# Patient Record
Sex: Female | Born: 1950 | Race: Black or African American | Hispanic: No | Marital: Married | Smoking: Never smoker
Health system: Southern US, Community
[De-identification: ages and names within clinical notes are randomized; demographics above are authoritative.]

## PROBLEM LIST (undated history)

## (undated) DIAGNOSIS — E119 Type 2 diabetes mellitus without complications: Secondary | ICD-10-CM

## (undated) DIAGNOSIS — I10 Essential (primary) hypertension: Secondary | ICD-10-CM

## (undated) HISTORY — DX: Type 2 diabetes mellitus without complications: E11.9

## (undated) HISTORY — DX: Essential (primary) hypertension: I10

---

## 2004-06-13 ENCOUNTER — Inpatient Hospital Stay (HOSPITAL_COMMUNITY): Admission: EM | Admit: 2004-06-13 | Discharge: 2004-06-22 | Payer: Self-pay | Admitting: Emergency Medicine

## 2004-06-13 ENCOUNTER — Ambulatory Visit: Payer: Self-pay | Admitting: Physical Medicine & Rehabilitation

## 2004-09-06 ENCOUNTER — Encounter: Admission: RE | Admit: 2004-09-06 | Discharge: 2004-12-05 | Payer: Self-pay | Admitting: Orthopedic Surgery

## 2004-12-06 ENCOUNTER — Encounter: Admission: RE | Admit: 2004-12-06 | Discharge: 2005-03-06 | Payer: Self-pay | Admitting: Orthopedic Surgery
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
## 2009-03-29 ENCOUNTER — Emergency Department (HOSPITAL_BASED_OUTPATIENT_CLINIC_OR_DEPARTMENT_OTHER): Admission: EM | Admit: 2009-03-29 | Discharge: 2009-03-29 | Payer: Self-pay | Admitting: Emergency Medicine

## 2009-03-29 ENCOUNTER — Ambulatory Visit: Payer: Self-pay | Admitting: Diagnostic Radiology

## 2009-07-15 ENCOUNTER — Encounter: Admission: RE | Admit: 2009-07-15 | Discharge: 2009-07-15 | Payer: Self-pay | Admitting: Orthopedic Surgery

## 2010-08-04 ENCOUNTER — Encounter: Admission: RE | Admit: 2010-08-04 | Discharge: 2010-08-04 | Payer: Self-pay | Admitting: Orthopedic Surgery

## 2010-09-20 ENCOUNTER — Encounter: Payer: Self-pay | Admitting: Gastroenterology

## 2010-09-26 ENCOUNTER — Encounter (INDEPENDENT_AMBULATORY_CARE_PROVIDER_SITE_OTHER): Payer: Self-pay | Admitting: *Deleted

## 2010-09-29 ENCOUNTER — Encounter (INDEPENDENT_AMBULATORY_CARE_PROVIDER_SITE_OTHER): Payer: Self-pay | Admitting: *Deleted

## 2010-09-29 ENCOUNTER — Ambulatory Visit
Admission: RE | Admit: 2010-09-29 | Discharge: 2010-09-29 | Payer: Self-pay | Source: Home / Self Care | Attending: Gastroenterology | Admitting: Gastroenterology

## 2010-10-12 NOTE — Letter (Signed)
Summary: [facility] Instructions  [facility]  [address] [place], [place] [address]   Phone: [phone]  Fax: [phone]       Felicia Grimes    March 04, 1951    MRN: [id number]        Procedure Day Dorna Bloom:  Farrell Ours 11/03/10     Arrival Time:  9:00am     Procedure Time: 10:00am     Location of Procedure:                    Juliann Pares _  [facility] (4th Floor)                       PREPARATION FOR COLONOSCOPY WITH MOVIPREP   Starting 5 days prior to your procedure SUNDAY  02/19  do not eat nuts, seeds, popcorn, corn, beans, peas,  salads, or any raw vegetables.  Do not take any fiber supplements (e.g. Metamucil, Citrucel, and Benefiber).  THE DAY BEFORE YOUR PROCEDURE         DATE:  THURSDAY 02/23  1.  Drink clear liquids the entire day-NO SOLID FOOD  2.  Do not drink anything colored red or purple.  Avoid juices with pulp.  No orange juice.  3.  Drink at least 64 oz. (8 glasses) of fluid/clear liquids during the day to prevent dehydration and help the prep work efficiently.  CLEAR LIQUIDS INCLUDE: Water Jello Ice Popsicles Tea (sugar ok, no milk/cream) Powdered fruit flavored drinks Coffee (sugar ok, no milk/cream) Gatorade Juice: apple, white grape, white cranberry  Lemonade Clear bullion, consomm, broth Carbonated beverages (any kind) Strained chicken noodle soup Hard Candy                             4.  In the morning, mix first dose of MoviPrep solution:    Empty 1 Pouch A and 1 Pouch B into the disposable container    Add lukewarm drinking water to the top line of the container. Mix to dissolve    Refrigerate (mixed solution should be used within 24 hrs)  5.  Begin drinking the prep at 5:00 p.m. The MoviPrep container is divided by 4 marks.   Every 15 minutes drink the solution down to the next mark (approximately 8 oz) until the full liter is complete.   6.  Follow completed prep with 16 oz of clear liquid of your choice (Nothing  red or purple).  Continue to drink clear liquids until bedtime.  7.  Before going to bed, mix second dose of MoviPrep solution:    Empty 1 Pouch A and 1 Pouch B into the disposable container    Add lukewarm drinking water to the top line of the container. Mix to dissolve    Refrigerate  THE DAY OF YOUR PROCEDURE      DATE: FRIDAY  02/24  Beginning at  5:00 a.m. (5 hours before procedure):         1. Every 15 minutes, drink the solution down to the next mark (approx 8 oz) until the full liter is complete.  2. Follow completed prep with 16 oz. of clear liquid of your choice.    3. You may drink clear liquids until  8:00am  (2 HOURS BEFORE PROCEDURE).   MEDICATION INSTRUCTIONS  Unless otherwise instructed, you should take regular prescription medications with a small sip of water   as early as possible  the morning of your procedure.  Diabetic patients - see separate instructions.           OTHER INSTRUCTIONS  You will need a responsible adult at least 61 years of age to accompany you and drive you home.   This person must remain in the waiting room during your procedure.  Wear loose fitting clothing that is easily removed.  Leave jewelry and other valuables at home.  However, you may wish to bring a book to read or  an iPod/MP3 player to listen to music as you wait for your procedure to start.  Remove all body piercing jewelry and leave at home.  Total time from sign-in until discharge is approximately 2-3 hours.  You should go home directly after your procedure and rest.  You can resume normal activities the  day after your procedure.  The day of your procedure you should not:   Drive   Make legal decisions   Operate machinery   Drink alcohol   Return to work  You will receive specific instructions about eating, activities and medications before you leave.    The above instructions have been reviewed and explained to me by   Sherren Kerns RN  September 29, 2010 11:12 AM    I fully understand and can verbalize these instructions _____________________________ Date _________

## 2010-10-12 NOTE — Miscellaneous (Signed)
Summary: previsit prep/rm  Clinical Lists Changes  Medications: Added new medication of MOVIPREP 100 GM  SOLR (PEG-KCL-NACL-NASULF-NA ASC-C) As per prep instructions. - Signed Rx of MOVIPREP 100 GM  SOLR (PEG-KCL-NACL-NASULF-NA ASC-C) As per prep instructions.;  #1 x 0;  Signed;  Entered by: Sherren Kerns RN;  Authorized by: Meryl Dare MD [facility];  Method used: Electronically to [id number] [address], [place], [place]  [address], Ph: [phone], Fax: [phone] Allergies: Added new allergy or adverse reaction of METACILLIAN Observations: Added new observation of NKA: F (09/29/2010 10:50)    Prescriptions: MOVIPREP 100 GM  SOLR (PEG-KCL-NACL-NASULF-NA ASC-C) As per prep instructions.  #1 x 0   Entered by:   Sherren Kerns RN   Authorized by:   Meryl Dare MD [facility]   Signed by:   Sherren Kerns RN on 09/29/2010   Method used:   Electronically to        [facility] (retail)       [address]       [place], [place]  [address]       Ph: [phone]       Fax: [phone]   RxID:   [id number]

## 2010-10-12 NOTE — Letter (Signed)
Summary: Pre Visit Letter Revised  [facility]  [address] [place], [place] [address]   Phone: [phone]  Fax: [phone]        09/20/2010 MRN: [id number]  Felicia Grimes [address] [place], [place]  [address]             Procedure Date:  2-15 at 9:30am           Direct Colon- Dr Russella Dar   Welcome to the Gastroenterology Division at [facility].    You are scheduled to see a nurse for your pre-procedure visit on 10-11-2010 at 10am on the 3rd floor at [facility], [address]  We ask that you try to arrive at our office 15 minutes prior to your appointment time to allow for check-in.  Please take a minute to review the attached form.  If you answer "Yes" to one or more of the questions on the first page, we ask that you call the person listed at your earliest opportunity.  If you answer "No" to all of the questions, please complete the rest of the form and bring it to your appointment.    Your nurse visit will consist of discussing your medical and surgical history, your immediate family medical history, and your medications.   If you are unable to list all of your medications on the form, please bring the medication bottles to your appointment and we will list them.  We will need to be aware of both prescribed and over the counter drugs.  We will need to know exact dosage information as well.    Please be prepared to read and sign documents such as consent forms, a financial agreement, and acknowledgement forms.  If necessary, and with your consent, a friend or relative is welcome to sit-in on the nurse visit with you.  Please bring your insurance card so that we may make a copy of it.  If your insurance requires a referral to see a specialist, please bring your referral form from your primary care physician.  No co-pay is required for this nurse visit.     If you cannot keep your appointment, please call [phone] to cancel or reschedule prior  to your appointment date.  This allows [place] the opportunity to schedule an appointment for another patient in need of care.    Thank you for choosing [facility] for your medical needs.  We appreciate the opportunity to care for you.  Please visit [place] at our website  to learn more about our practice.  Sincerely, The Gastroenterology Division

## 2010-10-12 NOTE — Letter (Signed)
Summary: Diabetic Instructions  [facility]  [address] [place], [place] [address]   Phone: [phone]  Fax: [phone]    Felicia Grimes [birth year] MRN: [id number]   _x  _   ORAL DIABETIC MEDICATION INSTRUCTIONS  The day before your procedure:   Take your diabetic pill as you do normally  The day of your procedure:   Do not take your diabetic pill    We will check your blood sugar levels during the admission process and again in Recovery before discharging you home  ________________________________________________________________________  _

## 2010-11-03 ENCOUNTER — Other Ambulatory Visit: Payer: Self-pay | Admitting: Gastroenterology
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
## 2011-01-26 NOTE — Op Note (Signed)
NAMESEHAJ, KOLDEN NO.:  [id number]   MEDICAL RECORD NO.:  [id number]          PATIENT TYPE:  INP   LOCATION:  5028                         FACILITY:  [facility]   PHYSICIAN:  Myrtie Neither, MD      DATE OF BIRTH:  [birth year]   DATE OF PROCEDURE:  06/14/2004  DATE OF DISCHARGE:  06/22/2004                                 OPERATIVE REPORT   PREOPERATIVE DIAGNOSIS:  Fracture dislocation of right hip with comminuted  fracture of the acetabulum.   ANESTHESIA:  General.   PROCEDURES:  1.  Open reduction and internal fixation of right acetabulum.  2.  Open reduction of right dislocated hip.   DESCRIPTION OF PROCEDURE:  The patient was taken to the operating room after  being given adequate preoperative medication, given anesthesia and  intubated.  The patient was placed in the right lateral position.  The right  hip was prepped with Duraprep and draped in a sterile manner.  Bovie used  for hemostasis.  A __________ approach was made over the right hip going  through the skin and subcutaneous tissue down to the fascia lata.  Sharp  rotators were identified.  Hematoma evacuated.  Sharp rotators were  released.  The femoral head was proximal to the acetabulum.  With gentle  traction, the femoral head was then reduced into the acetabulum.  Two  comminuted fragments of the posterior acetabulum were then identified along  the posterior lip.  Attempt at maintaining the hip reduced in a neutral  position to internal rotation demonstrated the hip was unstable.  The  femoral head had some abrasion to the articular surface.  The two fragments  were anatomically reduced and stabilize with two cancellus screws.  The  sciatic nerve was identified and found to be intact.  Copious irrigation  with antibiotic solution was done.  The capsule was reapproximated with 0  Vicryl.  Sharp rotators were pulled back up and sutured down.  The fascia  was approximated with 0 Vicryl, 2-0 for  subcutaneous and skin staples for  the skin.  A bulky compressive dressing was applied.  The patient was placed  in a hip abduction pillow to be fitted with a hip abduction brace later.  The patient tolerated the procedure quite well and went to the recovery room  in satisfactory and stable condition.      AC/MEDQ  D:  11/29/2004  T:  11/29/2004  Job:  [id number]

## 2011-01-26 NOTE — Op Note (Signed)
Felicia Grimes, KAHLER NO.:  [id number]   MEDICAL RECORD NO.:  [id number]          PATIENT TYPE:  INP   LOCATION:  5028                         FACILITY:  [facility]   PHYSICIAN:  Myrtie Neither, M.D.    DATE OF BIRTH:  03-22-1951   DATE OF PROCEDURE:  06/13/2004  DATE OF DISCHARGE:                                 OPERATIVE REPORT   PREOPERATIVE DIAGNOSIS:  Fracture dislocation right hip with fracture  posterior lip of acetabulum and dislocation of the hip joint.   POSTOPERATIVE DIAGNOSIS:  Fracture dislocation right hip with fracture  posterior lip of acetabulum and dislocation of the hip joint.   ANESTHESIA:  General.   PROCEDURE:  Open reduction and internal fixation of acetabular fracture.   DESCRIPTION OF PROCEDURE:  The patient was taken to the operating room and  given preoperative medications, general anesthesia, and intubated.  The  patient was placed in the right lateral position. C-arm was used to  visualize fracture reduction and extension.  The right hip was prepped with  DuraPrep and draped in a sterile manner.  The Bovie was used for hemostasis.  A posterior approach was made over the right hip going through the skin,  subcutaneous tissue, down to the fascia lata.  Sharp and blunt dissection  was made down to the fracture site.  The sciatic nerve was retracted  posteriorly.  After identification of the fracture site, a manipulated  reduction of the hip joint, itself, was done and the hip was reduced.  This  was visualized with the use of C-arm.  A large posterior fragment of the  acetabulum was knocked off.  Th fragments were removed from the joint and  irrigation was done.  Manipulated reduction of the fracture fragment was  done and stabilized with a K-wire followed by placement of two cancellous  compression screws.  The K-wire was removed.  The fracture site was fixed in  a good, stable, anatomic position.  Again, this was visualized with the use  of C-arm.  Copious and abundant irrigation was done.  Range of motion of the  hip was tested and found to be stable with stable fixation of the fracture  site.  The wound was closed with 0 Vicryl for the fascia, 2-0 for the  subcutaneous, and skin staples for the skin.  A compressive dressing was  applied.  The patient was placed in the hip abduction pillow and will be  placed in a hip abduction brace.  The patient tolerated the procedure well  and went to the recovery room in stable, satisfactory condition.       AC/MEDQ  D:  06/14/2004  T:  06/14/2004  Job:  [id number]
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
## 2012-07-21 ENCOUNTER — Ambulatory Visit
Admission: RE | Admit: 2012-07-21 | Discharge: 2012-07-21 | Disposition: A | Discharge status: Home / Self Care | Payer: BC Managed Care – PPO | Source: Ambulatory Visit | Attending: Orthopedic Surgery | Admitting: Orthopedic Surgery

## 2012-07-21 ENCOUNTER — Other Ambulatory Visit: Payer: Self-pay | Admitting: Orthopedic Surgery

## 2012-07-21 DIAGNOSIS — M1990 Unspecified osteoarthritis, unspecified site: Secondary | ICD-10-CM

## 2014-07-16 ENCOUNTER — Ambulatory Visit
Admission: RE | Admit: 2014-07-16 | Discharge: 2014-07-16 | Disposition: A | Discharge status: Home / Self Care | Payer: BC Managed Care – PPO | Source: Ambulatory Visit | Attending: Orthopedic Surgery | Admitting: Orthopedic Surgery

## 2014-07-16 ENCOUNTER — Other Ambulatory Visit: Payer: Self-pay | Admitting: Orthopedic Surgery

## 2014-07-16 DIAGNOSIS — M1610 Unilateral primary osteoarthritis, unspecified hip: Secondary | ICD-10-CM

## 2014-07-16 DIAGNOSIS — M5430 Sciatica, unspecified side: Secondary | ICD-10-CM

## 2022-07-20 ENCOUNTER — Emergency Department (HOSPITAL_BASED_OUTPATIENT_CLINIC_OR_DEPARTMENT_OTHER): Payer: Medicare PPO

## 2022-07-20 ENCOUNTER — Encounter (HOSPITAL_BASED_OUTPATIENT_CLINIC_OR_DEPARTMENT_OTHER): Payer: Self-pay | Admitting: Emergency Medicine

## 2022-07-20 ENCOUNTER — Emergency Department (HOSPITAL_BASED_OUTPATIENT_CLINIC_OR_DEPARTMENT_OTHER)
Admission: EM | Admit: 2022-07-20 | Discharge: 2022-07-21 | Disposition: A | Discharge status: Home / Self Care | Payer: Medicare PPO | Attending: Emergency Medicine | Admitting: Emergency Medicine

## 2022-07-20 ENCOUNTER — Other Ambulatory Visit: Payer: Self-pay

## 2022-07-20 DIAGNOSIS — R059 Cough, unspecified: Secondary | ICD-10-CM | POA: Insufficient documentation

## 2022-07-20 DIAGNOSIS — Z1152 Encounter for screening for COVID-19: Secondary | ICD-10-CM | POA: Insufficient documentation

## 2022-07-20 DIAGNOSIS — I10 Essential (primary) hypertension: Secondary | ICD-10-CM | POA: Insufficient documentation

## 2022-07-20 DIAGNOSIS — Z79899 Other long term (current) drug therapy: Secondary | ICD-10-CM | POA: Diagnosis not present

## 2022-07-20 DIAGNOSIS — R058 Other specified cough: Secondary | ICD-10-CM | POA: Insufficient documentation

## 2022-07-20 DIAGNOSIS — E119 Type 2 diabetes mellitus without complications: Secondary | ICD-10-CM | POA: Diagnosis not present

## 2022-07-20 DIAGNOSIS — R051 Acute cough: Secondary | ICD-10-CM

## 2022-07-20 LAB — RESP PANEL BY RT-PCR (FLU A&B, COVID) ARPGX2
Influenza A by PCR: NEGATIVE
Influenza B by PCR: NEGATIVE
SARS Coronavirus 2 by RT PCR: NEGATIVE

## 2022-07-20 NOTE — ED Triage Notes (Signed)
Cough x 5 days. Unrelieved with otc meds.

## 2022-07-21 MED ORDER — METOPROLOL SUCCINATE ER 50 MG PO TB24: 50.0000 mg | ORAL_TABLET | Freq: Every day | ORAL | 0 refills | Status: AC

## 2022-07-21 MED ORDER — NIFEDIPINE ER OSMOTIC RELEASE 30 MG PO TB24: 30.0000 mg | ORAL_TABLET | Freq: Every day | ORAL | 0 refills | Status: AC

## 2022-07-21 MED ORDER — HYDROCHLOROTHIAZIDE 12.5 MG PO TABS: 12.5000 mg | ORAL_TABLET | Freq: Every day | ORAL | 0 refills | Status: AC

## 2022-07-21 NOTE — ED Provider Notes (Signed)
[facility] EMERGENCY DEPARTMENT  Provider Note  CSN: [id number] Arrival date & time: 07/20/22 2111  History Chief Complaint  Patient presents with   Cough    Felicia Grimes is a 71 y.o. female with history of HTN, DM reports 5 days of cough, occasionally productive of sputum. No fever, no CP or SOB. She has tried OTC medications with some improvement. Called in Viola by PCP which did not help.   She also reports she lost her HTN medications, needs a refill.    Home Medications Prior to Admission medications   Medication Sig Start Date End Date Taking? Authorizing Provider  hydrochlorothiazide (HYDRODIURIL) 12.5 MG tablet Take 1 tablet (12.5 mg total) by mouth daily. 07/21/22 08/20/22 Yes Pollyann Savoy, MD  metoprolol succinate (TOPROL-XL) 50 MG 24 hr tablet Take 1 tablet (50 mg total) by mouth daily. 07/21/22 08/20/22 Yes Pollyann Savoy, MD  NIFEdipine (PROCARDIA XL) 30 MG 24 hr tablet Take 1 tablet (30 mg total) by mouth daily. 07/21/22 08/20/22 Yes Pollyann Savoy, MD     Allergies    Ampicillin   Review of Systems   Review of Systems Please see HPI for pertinent positives and negatives  Physical Exam BP (!) 160/98 (BP Location: Left Arm) Comment: hasn't taken BP meds in 2 days  Pulse (!) 113   Temp 98.6 F (37 C) (Oral)   Resp 18   Ht 5\' 4"  (1.626 m)   Wt 79.4 kg   SpO2 95%   BMI 30.04 kg/m   Physical Exam Vitals and nursing note reviewed.  Constitutional:      Appearance: Normal appearance.  HENT:     Head: Normocephalic and atraumatic.     Nose: Nose normal.     Mouth/Throat:     Mouth: Mucous membranes are moist.  Eyes:     Extraocular Movements: Extraocular movements intact.     Conjunctiva/sclera: Conjunctivae normal.  Cardiovascular:     Rate and Rhythm: Normal rate.  Pulmonary:     Effort: Pulmonary effort is normal.     Breath sounds: Normal breath sounds. No wheezing, rhonchi or rales.  Abdominal:     General:  Abdomen is flat.     Palpations: Abdomen is soft.     Tenderness: There is no abdominal tenderness.  Musculoskeletal:        General: No swelling. Normal range of motion.     Cervical back: Neck supple.  Skin:    General: Skin is warm and dry.  Neurological:     General: No focal deficit present.     Mental Status: She is alert.  Psychiatric:        Mood and Affect: Mood normal.     ED Results / Procedures / Treatments   EKG None  Procedures Procedures  Medications Ordered in the ED Medications - No data to display  Initial Impression and Plan  Patient here with nonspecific cough. Covid/Flu are neg. I personally viewed the images from radiology studies and agree with radiologist interpretation: CXR is clear. Exam is benign. Recommend she continue with OTC symptomatic treatments. No indication for Abx or steroids at this time. PCP follow up. HTN meds refilled.   ED Course       MDM Rules/Calculators/A&P Medical Decision Making Problems Addressed: Acute cough: acute illness or injury Uncontrolled hypertension: chronic illness or injury  Amount and/or Complexity of Data Reviewed Labs: ordered. Decision-making details documented in ED Course. Radiology: ordered and independent interpretation performed.  Decision-making details documented in ED Course.  Risk Prescription drug management.    Final Clinical Impression(s) / ED Diagnoses Final diagnoses:  Acute cough  Uncontrolled hypertension    Rx / DC Orders ED Discharge Orders          Ordered    hydrochlorothiazide (HYDRODIURIL) 12.5 MG tablet  Daily        07/21/22 0022    metoprolol succinate (TOPROL-XL) 50 MG 24 hr tablet  Daily        07/21/22 0022    NIFEdipine (PROCARDIA XL) 30 MG 24 hr tablet  Daily        07/21/22 0022             Pollyann Savoy, MD 07/21/22 [phone]
